# Patient Record
Sex: Male | Born: 1980 | Race: Black or African American | Hispanic: No | Marital: Single | Smoking: Current every day smoker
Health system: Southern US, Community
[De-identification: ages and names within clinical notes are randomized; demographics above are authoritative.]

## PROBLEM LIST (undated history)

## (undated) DIAGNOSIS — J45909 Unspecified asthma, uncomplicated: Secondary | ICD-10-CM

## (undated) HISTORY — PX: MOUTH SURGERY: SHX715

---

## 1998-03-09 ENCOUNTER — Emergency Department (HOSPITAL_COMMUNITY): Admission: EM | Admit: 1998-03-09 | Discharge: 1998-03-09 | Payer: Self-pay | Admitting: Emergency Medicine

## 2004-01-17 ENCOUNTER — Emergency Department (HOSPITAL_COMMUNITY): Admission: AD | Admit: 2004-01-17 | Discharge: 2004-01-17 | Payer: Self-pay | Admitting: Family Medicine

## 2006-03-22 ENCOUNTER — Emergency Department (HOSPITAL_COMMUNITY): Admission: EM | Admit: 2006-03-22 | Discharge: 2006-03-23 | Payer: Self-pay | Admitting: Emergency Medicine

## 2006-03-29 ENCOUNTER — Emergency Department (HOSPITAL_COMMUNITY): Admission: EM | Admit: 2006-03-29 | Discharge: 2006-03-29 | Payer: Self-pay | Admitting: Family Medicine

## 2006-12-26 ENCOUNTER — Emergency Department (HOSPITAL_COMMUNITY): Admission: EM | Admit: 2006-12-26 | Discharge: 2006-12-26 | Payer: Self-pay | Admitting: *Deleted

## 2014-02-07 ENCOUNTER — Encounter (HOSPITAL_COMMUNITY): Payer: Self-pay | Admitting: Emergency Medicine

## 2014-02-07 ENCOUNTER — Emergency Department (HOSPITAL_COMMUNITY)
Admission: EM | Admit: 2014-02-07 | Discharge: 2014-02-07 | Disposition: A | Discharge status: Home / Self Care | Payer: Self-pay | Attending: Emergency Medicine | Admitting: Emergency Medicine

## 2014-02-07 DIAGNOSIS — S61209A Unspecified open wound of unspecified finger without damage to nail, initial encounter: Secondary | ICD-10-CM | POA: Insufficient documentation

## 2014-02-07 DIAGNOSIS — W268XXA Contact with other sharp object(s), not elsewhere classified, initial encounter: Secondary | ICD-10-CM | POA: Insufficient documentation

## 2014-02-07 DIAGNOSIS — S61219A Laceration without foreign body of unspecified finger without damage to nail, initial encounter: Secondary | ICD-10-CM

## 2014-02-07 DIAGNOSIS — Z23 Encounter for immunization: Secondary | ICD-10-CM | POA: Insufficient documentation

## 2014-02-07 DIAGNOSIS — Y929 Unspecified place or not applicable: Secondary | ICD-10-CM | POA: Insufficient documentation

## 2014-02-07 DIAGNOSIS — F17200 Nicotine dependence, unspecified, uncomplicated: Secondary | ICD-10-CM | POA: Insufficient documentation

## 2014-02-07 DIAGNOSIS — Y9389 Activity, other specified: Secondary | ICD-10-CM | POA: Insufficient documentation

## 2014-02-07 MED ORDER — TETANUS-DIPHTH-ACELL PERTUSSIS 5-2.5-18.5 LF-MCG/0.5 IM SUSP
0.5000 mL | Freq: Once | INTRAMUSCULAR | Status: AC
  Administered 2014-02-07: 0.5 mL via INTRAMUSCULAR
  Filled 2014-02-07: qty 0.5

## 2014-02-07 NOTE — ED Provider Notes (Signed)
CSN: [id number]     Arrival date & time 02/07/14  1237 History  This chart was scribed for Santiago GladHeather Iracema Lanagan, PA-C  working with Juliet RudeNathan R. Rubin PayorPickering, MD by Ashley JacobsBrittany Andrews, ED scribe. This patient was seen in room TR05C/TR05C and the patient's care was started at 1:33 PM.     First MD Initiated Contact with Patient 02/07/14 1249     Chief Complaint  Patient presents with  . Laceration     (Consider location/radiation/quality/duration/timing/severity/associated sxs/prior Treatment) HPI HPI Comments: Shelva Majesticravis L Goodell is a 33 y.o. male who presents to the Emergency Department complaining of a laceration to his left middle finger. Pt lacerated his finger with a razor while reaching in a drawer. Denies numbness and tingling.  Denies taking any pain medication. The bleeding is controlled. He cleaned the site PTA. Pt does not have a tetanus shot.     History reviewed. No pertinent past medical history. History reviewed. No pertinent past surgical history. History reviewed. No pertinent family history. History  Substance Use Topics  . Smoking status: Current Every Day Smoker    Types: Cigarettes  . Smokeless tobacco: Not on file  . Alcohol Use: Yes    Review of Systems  Skin: Positive for wound.       Left middle finger laceration    Neurological: Negative for numbness.       No tingling   All other systems reviewed and are negative.     Allergies  Review of patient's allergies indicates no known allergies.  Home Medications   Prior to Admission medications   Not on File   BP 120/81  Pulse 84  Temp(Src) 97.8 F (36.6 C) (Oral)  Resp 15  Ht 6\' 2"  (1.88 m)  Wt 220 lb (99.791 kg)  BMI 28.23 kg/m2  SpO2 99% Physical Exam  Nursing note and vitals reviewed. Constitutional: He is oriented to person, place, and time. He appears well-developed and well-nourished. No distress.  HENT:  Head: Normocephalic and atraumatic.  Cardiovascular: Normal rate, regular rhythm and  normal heart sounds.   Good capillary refil of the left third digit.   Pulmonary/Chest: Effort normal and breath sounds normal.  Musculoskeletal: Normal range of motion.      Neurological: He is alert and oriented to person, place, and time.  Sensation is intact of 3rd digit.   Skin: Skin is warm and dry.  2 cm linear superficial laceration of the finger pad of the third digit. Bleeding is well controlled.  Normal capillary refill of 3rd digit.  Psychiatric: He has a normal mood and affect.    ED Course  Procedures (including critical care time) DIAGNOSTIC STUDIES: Oxygen Saturation is 99% on room air, normal by my interpretation.    COORDINATION OF CARE:   1:37 PM Discussed course of care with pt which includes T-dap vaccination and wound care. Pt understands and agrees. 2:42 PM LACERATION REPAIR Performed by: Santiago GladHeather Latrina Guttman Consent: Verbal consent obtained. Risks and benefits: risks, benefits and alternatives were discussed Time out performed prior to procedure Prepped and Draped in normal sterile fashion Wound explored Laceration Location: left middle finger Laceration Length: 2 cm No Foreign Bodies seen or palpated Skin closure: Dermabond Topical Skin Adhesive Patient tolerance: Patient tolerated the procedure well with no immediate complications.   Labs Review Labs Reviewed - No data to display  Imaging Review No results found.   EKG Interpretation None      MDM   Final diagnoses:  None   Patient presenting  with superficial laceration of the third digit.  Laceration repaired with Dermabond.  Laceration cleaned well.  Tetanus updated.  Patient neurovascularly intact.  Stable for discharge.    Santiago GladHeather Jadis Pitter, PA-C 02/10/14 2311

## 2014-02-07 NOTE — ED Notes (Signed)
Pt cut posterior aspect of L middle finger with razor just pta. Bleeding controlled. Cms intact.

## 2014-02-12 NOTE — ED Provider Notes (Signed)
Medical screening examination/treatment/procedure(s) were performed by non-physician practitioner and as supervising physician I was immediately available for consultation/collaboration.   EKG Interpretation None       Finnley Larusso R. Gearl Baratta, MD 02/12/14 0814 

## 2019-02-19 ENCOUNTER — Emergency Department (HOSPITAL_COMMUNITY): Payer: Self-pay

## 2019-02-19 ENCOUNTER — Encounter (HOSPITAL_COMMUNITY): Payer: Self-pay | Admitting: Emergency Medicine

## 2019-02-19 ENCOUNTER — Other Ambulatory Visit: Payer: Self-pay

## 2019-02-19 ENCOUNTER — Emergency Department (HOSPITAL_COMMUNITY)
Admission: EM | Admit: 2019-02-19 | Discharge: 2019-02-19 | Disposition: A | Discharge status: Home / Self Care | Payer: Self-pay | Attending: Physical Medicine & Rehabilitation | Admitting: Physical Medicine & Rehabilitation

## 2019-02-19 DIAGNOSIS — Y939 Activity, unspecified: Secondary | ICD-10-CM | POA: Insufficient documentation

## 2019-02-19 DIAGNOSIS — Y999 Unspecified external cause status: Secondary | ICD-10-CM | POA: Insufficient documentation

## 2019-02-19 DIAGNOSIS — F17210 Nicotine dependence, cigarettes, uncomplicated: Secondary | ICD-10-CM | POA: Insufficient documentation

## 2019-02-19 DIAGNOSIS — Y929 Unspecified place or not applicable: Secondary | ICD-10-CM | POA: Insufficient documentation

## 2019-02-19 DIAGNOSIS — S61411A Laceration without foreign body of right hand, initial encounter: Secondary | ICD-10-CM | POA: Insufficient documentation

## 2019-02-19 DIAGNOSIS — Z23 Encounter for immunization: Secondary | ICD-10-CM | POA: Insufficient documentation

## 2019-02-19 DIAGNOSIS — S42002A Fracture of unspecified part of left clavicle, initial encounter for closed fracture: Secondary | ICD-10-CM | POA: Insufficient documentation

## 2019-02-19 DIAGNOSIS — F1290 Cannabis use, unspecified, uncomplicated: Secondary | ICD-10-CM | POA: Insufficient documentation

## 2019-02-19 MED ORDER — SODIUM CHLORIDE 0.9 % IV BOLUS
500.0000 mL | Freq: Once | INTRAVENOUS | Status: AC
  Administered 2019-02-19: 500 mL via INTRAVENOUS

## 2019-02-19 MED ORDER — MORPHINE SULFATE (PF) 4 MG/ML IV SOLN
4.0000 mg | Freq: Once | INTRAVENOUS | Status: AC
  Administered 2019-02-19: 4 mg via INTRAVENOUS
  Filled 2019-02-19: qty 1

## 2019-02-19 MED ORDER — MORPHINE SULFATE (PF) 4 MG/ML IV SOLN
4.0000 mg | Freq: Once | INTRAVENOUS | Status: AC
  Administered 2019-02-19: 17:00:00 4 mg via INTRAVENOUS
  Filled 2019-02-19: qty 1

## 2019-02-19 MED ORDER — TETANUS-DIPHTH-ACELL PERTUSSIS 5-2.5-18.5 LF-MCG/0.5 IM SUSP
0.5000 mL | Freq: Once | INTRAMUSCULAR | Status: AC
  Administered 2019-02-19: 0.5 mL via INTRAMUSCULAR
  Filled 2019-02-19: qty 0.5

## 2019-02-19 MED ORDER — CEFAZOLIN SODIUM-DEXTROSE 1-4 GM/50ML-% IV SOLN: 1.0000 g | Freq: Once | INTRAVENOUS | Status: DC

## 2019-02-19 MED ORDER — CEPHALEXIN 500 MG PO CAPS: 500.0000 mg | ORAL_CAPSULE | Freq: Four times a day (QID) | ORAL | 0 refills | Status: AC

## 2019-02-19 MED ORDER — HYDROCODONE-ACETAMINOPHEN 5-325 MG PO TABS: 1.0000 | ORAL_TABLET | Freq: Four times a day (QID) | ORAL | 0 refills | Status: DC | PRN

## 2019-02-19 MED ORDER — LIDOCAINE HCL 2 % IJ SOLN
10.0000 mL | Freq: Once | INTRAMUSCULAR | Status: DC
  Filled 2019-02-19: qty 20
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
## 2019-02-19 NOTE — ED Notes (Signed)
Rodman Key [phone], wife, please call with updates

## 2019-02-19 NOTE — Progress Notes (Signed)
Orthopedic Tech Progress Note Patient Details:  Timothy Bryan 08-30-1981 [id number]  Ortho Devices Type of Ortho Device: Shoulder immobilizer Ortho Device/Splint Interventions: Adjustment, Application, Ordered   Post Interventions Patient Tolerated: Well Instructions Provided: Care of device, Adjustment of device   Norva Karvonen T 02/19/2019, 5:17 PM
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
## 2019-02-19 NOTE — Discharge Instructions (Signed)
Please take the antibiotic as directed to help prevent an infection in your wound.  Please follow-up for suture removal at either urgent care ,the emergency department, or your primary care doctor in 14 days.  Please follow-up with the orthopedic doctor in regards to your clavicle fracture.  You were given a prescription for pain medication.  Please take as directed.  Do not drive, operate machinery, go to work or drink alcohol while taking this medication as it can make you very sedated.  Please return to the emergency room immediately if you experience any new or worsening symptoms or any symptoms that indicate worsening infection such as fevers, increased redness/swelling/pain, warmth, or drainage from the affected area.

## 2019-02-21 ENCOUNTER — Ambulatory Visit (INDEPENDENT_AMBULATORY_CARE_PROVIDER_SITE_OTHER): Payer: Self-pay

## 2019-02-21 ENCOUNTER — Other Ambulatory Visit: Payer: Self-pay

## 2019-02-21 ENCOUNTER — Encounter (HOSPITAL_BASED_OUTPATIENT_CLINIC_OR_DEPARTMENT_OTHER): Payer: Self-pay | Admitting: *Deleted

## 2019-02-21 ENCOUNTER — Ambulatory Visit: Payer: Self-pay | Admitting: Orthopaedic Surgery

## 2019-02-21 DIAGNOSIS — S42032A Displaced fracture of lateral end of left clavicle, initial encounter for closed fracture: Secondary | ICD-10-CM

## 2019-02-21 DIAGNOSIS — S4382XA Sprain of other specified parts of left shoulder girdle, initial encounter: Secondary | ICD-10-CM
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
## 2019-02-24 ENCOUNTER — Other Ambulatory Visit: Payer: Self-pay

## 2019-02-24 ENCOUNTER — Encounter (HOSPITAL_BASED_OUTPATIENT_CLINIC_OR_DEPARTMENT_OTHER)
Admission: RE | Admit: 2019-02-24 | Discharge: 2019-02-24 | Disposition: A | Discharge status: Home / Self Care | Payer: Self-pay | Source: Ambulatory Visit | Attending: Orthopaedic Surgery | Admitting: Orthopaedic Surgery

## 2019-02-24 ENCOUNTER — Other Ambulatory Visit (HOSPITAL_COMMUNITY)
Admission: RE | Admit: 2019-02-24 | Discharge: 2019-02-24 | Disposition: A | Discharge status: Home / Self Care | Payer: HRSA Program | Source: Ambulatory Visit | Attending: Orthopaedic Surgery | Admitting: Orthopaedic Surgery

## 2019-02-24 DIAGNOSIS — Z1159 Encounter for screening for other viral diseases: Secondary | ICD-10-CM | POA: Diagnosis not present

## 2019-02-24 DIAGNOSIS — Z01812 Encounter for preprocedural laboratory examination: Secondary | ICD-10-CM | POA: Diagnosis present

## 2019-02-24 LAB — CBC WITH DIFFERENTIAL/PLATELET
Abs Immature Granulocytes: 0.01 10*3/uL (ref 0.00–0.07)
Basophils Absolute: 0 10*3/uL (ref 0.0–0.1)
Basophils Relative: 0 %
Eosinophils Absolute: 0.2 10*3/uL (ref 0.0–0.5)
Eosinophils Relative: 3 %
HCT: 43.8 % (ref 39.0–52.0)
Hemoglobin: 13.9 g/dL (ref 13.0–17.0)
Immature Granulocytes: 0 %
Lymphocytes Relative: 25 %
Lymphs Abs: 1.3 10*3/uL (ref 0.7–4.0)
MCH: 23.8 pg — ABNORMAL LOW (ref 26.0–34.0)
MCHC: 31.7 g/dL (ref 30.0–36.0)
MCV: 74.9 fL — ABNORMAL LOW (ref 80.0–100.0)
Monocytes Absolute: 0.3 10*3/uL (ref 0.1–1.0)
Monocytes Relative: 6 %
Neutro Abs: 3.4 10*3/uL (ref 1.7–7.7)
Neutrophils Relative %: 66 %
Platelets: 299 10*3/uL (ref 150–400)
RBC: 5.85 MIL/uL — ABNORMAL HIGH (ref 4.22–5.81)
RDW: 14 % (ref 11.5–15.5)
WBC: 5.2 10*3/uL (ref 4.0–10.5)
nRBC: 0 % (ref 0.0–0.2)

## 2019-02-24 LAB — COMPREHENSIVE METABOLIC PANEL
ALT: 19 U/L (ref 0–44)
AST: 25 U/L (ref 15–41)
Albumin: 3.7 g/dL (ref 3.5–5.0)
Alkaline Phosphatase: 78 U/L (ref 38–126)
Anion gap: 9 (ref 5–15)
BUN: 5 mg/dL — ABNORMAL LOW (ref 6–20)
CO2: 28 mmol/L (ref 22–32)
Calcium: 9.2 mg/dL (ref 8.9–10.3)
Chloride: 101 mmol/L (ref 98–111)
Creatinine, Ser: 0.98 mg/dL (ref 0.61–1.24)
GFR calc Af Amer: >60 mL/min (ref >60)
GFR calc non Af Amer: >60 mL/min (ref >60)
Glucose, Bld: 96 mg/dL (ref 70–99)
Potassium: 4.2 mmol/L (ref 3.5–5.1)
Sodium: 138 mmol/L (ref 135–145)
Total Bilirubin: 0.5 mg/dL (ref 0.3–1.2)
Total Protein: 7 g/dL (ref 6.5–8.1)

## 2019-02-25 LAB — NOVEL CORONAVIRUS, NAA (HOSP ORDER, SEND-OUT TO REF LAB; TAT 18-24 HRS): SARS-CoV-2, NAA: NOT DETECTED

## 2019-02-25 NOTE — Anesthesia Preprocedure Evaluation (Addendum)
Anesthesia Evaluation  Patient identified by MRN, date of birth, ID band Patient awake    Reviewed: Allergy & Precautions, NPO status , Patient's Chart, lab work & pertinent test results  Airway Mallampati: II  TM Distance: >3 FB Neck ROM: Full    Dental no notable dental hx. (+) Dental Advisory Given   Pulmonary asthma , Current Smoker,    Pulmonary exam normal        Cardiovascular negative cardio ROS Normal cardiovascular exam     Neuro/Psych negative neurological ROS     GI/Hepatic negative GI ROS, Neg liver ROS,   Endo/Other  negative endocrine ROS  Renal/GU negative Renal ROS     Musculoskeletal   Abdominal   Peds  Hematology negative hematology ROS (+)   Anesthesia Other Findings Day of surgery medications reviewed with the patient.  Reproductive/Obstetrics                            Anesthesia Physical Anesthesia Plan  ASA: II  Anesthesia Plan: General   Post-op Pain Management:    Induction: Intravenous  PONV Risk Score and Plan: 2 and Ondansetron and Dexamethasone  Airway Management Planned: Oral ETT  Additional Equipment:   Intra-op Plan:   Post-operative Plan: Extubation in OR  Informed Consent: I have reviewed the patients History and Physical, chart, labs and discussed the procedure including the risks, benefits and alternatives for the proposed anesthesia with the patient or authorized representative who has indicated his/her understanding and acceptance.     Dental advisory given  Plan Discussed with: Anesthesiologist and CRNA  Anesthesia Plan Comments:        Anesthesia Quick Evaluation

## 2019-02-26 ENCOUNTER — Ambulatory Visit (HOSPITAL_BASED_OUTPATIENT_CLINIC_OR_DEPARTMENT_OTHER)
Admission: RE | Admit: 2019-02-26 | Discharge: 2019-02-26 | Disposition: A | Discharge status: Home / Self Care | Payer: Self-pay | Attending: Orthopaedic Surgery | Admitting: Orthopaedic Surgery

## 2019-02-26 ENCOUNTER — Ambulatory Visit (HOSPITAL_COMMUNITY): Payer: Self-pay

## 2019-02-26 ENCOUNTER — Other Ambulatory Visit: Payer: Self-pay

## 2019-02-26 ENCOUNTER — Encounter (HOSPITAL_BASED_OUTPATIENT_CLINIC_OR_DEPARTMENT_OTHER)
Admission: RE | Disposition: A | Discharge status: Home / Self Care | Payer: Self-pay | Source: Home / Self Care | Attending: Orthopaedic Surgery

## 2019-02-26 ENCOUNTER — Ambulatory Visit (HOSPITAL_BASED_OUTPATIENT_CLINIC_OR_DEPARTMENT_OTHER): Payer: Self-pay | Admitting: Anesthesiology

## 2019-02-26 ENCOUNTER — Encounter (HOSPITAL_BASED_OUTPATIENT_CLINIC_OR_DEPARTMENT_OTHER): Payer: Self-pay | Admitting: Anesthesiology

## 2019-02-26 DIAGNOSIS — S42032A Displaced fracture of lateral end of left clavicle, initial encounter for closed fracture: Secondary | ICD-10-CM

## 2019-02-26 DIAGNOSIS — X58XXXA Exposure to other specified factors, initial encounter: Secondary | ICD-10-CM | POA: Insufficient documentation

## 2019-02-26 DIAGNOSIS — S4382XA Sprain of other specified parts of left shoulder girdle, initial encounter: Secondary | ICD-10-CM

## 2019-02-26 DIAGNOSIS — F17210 Nicotine dependence, cigarettes, uncomplicated: Secondary | ICD-10-CM | POA: Insufficient documentation

## 2019-02-26 DIAGNOSIS — Z4789 Encounter for other orthopedic aftercare: Secondary | ICD-10-CM

## 2019-02-26 DIAGNOSIS — J45909 Unspecified asthma, uncomplicated: Secondary | ICD-10-CM | POA: Insufficient documentation

## 2019-02-26 DIAGNOSIS — Z9889 Other specified postprocedural states: Secondary | ICD-10-CM

## 2019-02-26 HISTORY — DX: Unspecified asthma, uncomplicated: J45.909

## 2019-02-26 HISTORY — PX: ORIF CLAVICULAR FRACTURE: SHX5055

## 2019-02-26 SURGERY — OPEN REDUCTION INTERNAL FIXATION (ORIF) CLAVICULAR FRACTURE
Anesthesia: General | Site: Shoulder | Laterality: Left

## 2019-02-26 MED ORDER — CEFAZOLIN SODIUM-DEXTROSE 2-4 GM/100ML-% IV SOLN
INTRAVENOUS | Status: AC
  Filled 2019-02-26: qty 100

## 2019-02-26 MED ORDER — MIDAZOLAM HCL 2 MG/2ML IJ SOLN
INTRAMUSCULAR | Status: AC
  Filled 2019-02-26: qty 2

## 2019-02-26 MED ORDER — FENTANYL CITRATE (PF) 100 MCG/2ML IJ SOLN
INTRAMUSCULAR | Status: AC
  Filled 2019-02-26: qty 2

## 2019-02-26 MED ORDER — FENTANYL CITRATE (PF) 100 MCG/2ML IJ SOLN: 25.0000 ug | INTRAMUSCULAR | Status: DC | PRN

## 2019-02-26 MED ORDER — CEFAZOLIN SODIUM-DEXTROSE 2-4 GM/100ML-% IV SOLN
2.0000 g | INTRAVENOUS | Status: AC
  Administered 2019-02-26: 08:00:00 2 g via INTRAVENOUS

## 2019-02-26 MED ORDER — DEXAMETHASONE SODIUM PHOSPHATE 4 MG/ML IJ SOLN
INTRAMUSCULAR | Status: DC | PRN
  Administered 2019-02-26: 10 mg via INTRAVENOUS

## 2019-02-26 MED ORDER — ONDANSETRON HCL 4 MG/2ML IJ SOLN
INTRAMUSCULAR | Status: AC
  Filled 2019-02-26: qty 2

## 2019-02-26 MED ORDER — ROCURONIUM BROMIDE 100 MG/10ML IV SOLN
INTRAVENOUS | Status: DC | PRN
  Administered 2019-02-26: 70 mg via INTRAVENOUS

## 2019-02-26 MED ORDER — METHOCARBAMOL 750 MG PO TABS: 750.0000 mg | ORAL_TABLET | Freq: Two times a day (BID) | ORAL | 0 refills | Status: AC | PRN

## 2019-02-26 MED ORDER — MIDAZOLAM HCL 2 MG/2ML IJ SOLN
1.0000 mg | INTRAMUSCULAR | Status: DC | PRN
  Administered 2019-02-26: 08:00:00 2 mg via INTRAVENOUS

## 2019-02-26 MED ORDER — PHENYLEPHRINE 40 MCG/ML (10ML) SYRINGE FOR IV PUSH (FOR BLOOD PRESSURE SUPPORT)
PREFILLED_SYRINGE | INTRAVENOUS | Status: DC | PRN
  Administered 2019-02-26: 80 ug via INTRAVENOUS

## 2019-02-26 MED ORDER — BUPIVACAINE HCL (PF) 0.25 % IJ SOLN
INTRAMUSCULAR | Status: DC | PRN
  Administered 2019-02-26: 15 mL

## 2019-02-26 MED ORDER — SUGAMMADEX SODIUM 200 MG/2ML IV SOLN
INTRAVENOUS | Status: DC | PRN
  Administered 2019-02-26: 200 mg via INTRAVENOUS

## 2019-02-26 MED ORDER — PROPOFOL 500 MG/50ML IV EMUL
INTRAVENOUS | Status: AC
  Filled 2019-02-26: qty 50

## 2019-02-26 MED ORDER — BUPIVACAINE LIPOSOME 1.3 % IJ SUSP
INTRAMUSCULAR | Status: DC | PRN
  Administered 2019-02-26: 10 mL via PERINEURAL

## 2019-02-26 MED ORDER — ACETAMINOPHEN 500 MG PO TABS
ORAL_TABLET | ORAL | Status: AC
  Filled 2019-02-26: qty 2

## 2019-02-26 MED ORDER — SODIUM CHLORIDE 0.9 % IR SOLN
Status: DC | PRN
  Administered 2019-02-26: 6000 mL

## 2019-02-26 MED ORDER — BUPIVACAINE-EPINEPHRINE (PF) 0.25% -1:200000 IJ SOLN
INTRAMUSCULAR | Status: DC | PRN
  Administered 2019-02-26: 30 mL

## 2019-02-26 MED ORDER — OXYCODONE-ACETAMINOPHEN 5-325 MG PO TABS: 1.0000 | ORAL_TABLET | Freq: Four times a day (QID) | ORAL | 0 refills | Status: AC | PRN

## 2019-02-26 MED ORDER — FENTANYL CITRATE (PF) 100 MCG/2ML IJ SOLN
50.0000 ug | INTRAMUSCULAR | Status: AC | PRN
  Administered 2019-02-26: 11:00:00 50 ug via INTRAVENOUS
  Administered 2019-02-26: 08:00:00 100 ug via INTRAVENOUS
  Administered 2019-02-26 (×2): 25 ug via INTRAVENOUS

## 2019-02-26 MED ORDER — CHLORHEXIDINE GLUCONATE 4 % EX LIQD: 60.0000 mL | Freq: Once | CUTANEOUS | Status: DC

## 2019-02-26 MED ORDER — DEXAMETHASONE SODIUM PHOSPHATE 10 MG/ML IJ SOLN
INTRAMUSCULAR | Status: AC
  Filled 2019-02-26: qty 1

## 2019-02-26 MED ORDER — LACTATED RINGERS IV SOLN
INTRAVENOUS | Status: DC
  Administered 2019-02-26 (×2): via INTRAVENOUS

## 2019-02-26 MED ORDER — ONDANSETRON HCL 4 MG PO TABS: 4.0000 mg | ORAL_TABLET | Freq: Three times a day (TID) | ORAL | 0 refills | Status: AC | PRN

## 2019-02-26 MED ORDER — ONDANSETRON HCL 4 MG/2ML IJ SOLN
INTRAMUSCULAR | Status: DC | PRN
  Administered 2019-02-26: 4 mg via INTRAVENOUS

## 2019-02-26 MED ORDER — SUGAMMADEX SODIUM 200 MG/2ML IV SOLN
INTRAVENOUS | Status: AC
  Filled 2019-02-26: qty 2

## 2019-02-26 MED ORDER — PROMETHAZINE HCL 25 MG/ML IJ SOLN: 6.2500 mg | INTRAMUSCULAR | Status: DC | PRN

## 2019-02-26 MED ORDER — SCOPOLAMINE 1 MG/3DAYS TD PT72: 1.0000 | MEDICATED_PATCH | Freq: Once | TRANSDERMAL | Status: DC | PRN

## 2019-02-26 MED ORDER — ROCURONIUM BROMIDE 10 MG/ML (PF) SYRINGE
PREFILLED_SYRINGE | INTRAVENOUS | Status: AC
  Filled 2019-02-26: qty 10

## 2019-02-26 MED ORDER — LACTATED RINGERS IV SOLN
INTRAVENOUS | Status: DC
  Administered 2019-02-26: 07:00:00 via INTRAVENOUS

## 2019-02-26 MED ORDER — PROPOFOL 10 MG/ML IV BOLUS
INTRAVENOUS | Status: DC | PRN
  Administered 2019-02-26: 50 mg via INTRAVENOUS
  Administered 2019-02-26: 150 mg via INTRAVENOUS

## 2019-02-26 MED ORDER — LIDOCAINE 2% (20 MG/ML) 5 ML SYRINGE
INTRAMUSCULAR | Status: AC
  Filled 2019-02-26: qty 5

## 2019-02-26 MED ORDER — ACETAMINOPHEN 500 MG PO TABS
1000.0000 mg | ORAL_TABLET | Freq: Once | ORAL | Status: AC
  Administered 2019-02-26: 1000 mg via ORAL

## 2019-02-26 SURGICAL SUPPLY — 73 items
APL SKNCLS STERI-STRIP NONHPOA (GAUZE/BANDAGES/DRESSINGS)
BENZOIN TINCTURE PRP APPL 2/3 (GAUZE/BANDAGES/DRESSINGS) IMPLANT
BIT DRILL 2 CANN GRADUATED (BIT) ×2 IMPLANT
BIT DRILL 2.5 CANN ENDOSCOPIC (BIT) ×2 IMPLANT
BIT DRILL 2.5 CANN LNG (BIT) ×2 IMPLANT
BLADE SURG 15 STRL LF DISP TIS (BLADE) ×2 IMPLANT
BLADE SURG 15 STRL SS (BLADE) ×6
BUTTON DIST CLAVICLE PLATE TR (Anchor) ×2 IMPLANT
CLOSURE WOUND 1/2 X4 (GAUZE/BANDAGES/DRESSINGS) ×1
COVER WAND RF STERILE (DRAPES) IMPLANT
DRAPE C-ARM 42X72 X-RAY (DRAPES) ×3 IMPLANT
DRAPE IMP U-DRAPE 54X76 (DRAPES) ×3 IMPLANT
DRAPE INCISE IOBAN 66X45 STRL (DRAPES) ×3 IMPLANT
DRAPE U-SHAPE 47X51 STRL (DRAPES) ×6 IMPLANT
DRAPE U-SHAPE 76X120 STRL (DRAPES) ×6 IMPLANT
DRSG MEPILEX BORDER 4X8 (GAUZE/BANDAGES/DRESSINGS) ×2 IMPLANT
DRSG PAD ABDOMINAL 8X10 ST (GAUZE/BANDAGES/DRESSINGS) ×3 IMPLANT
DURAPREP 26ML APPLICATOR (WOUND CARE) ×3 IMPLANT
ELECT REM PT RETURN 9FT ADLT (ELECTROSURGICAL) ×3
ELECTRODE REM PT RTRN 9FT ADLT (ELECTROSURGICAL) ×1 IMPLANT
GAUZE SPONGE 4X4 12PLY STRL (GAUZE/BANDAGES/DRESSINGS) ×3 IMPLANT
GLOVE BIO SURGEON STRL SZ 6.5 (GLOVE) ×1 IMPLANT
GLOVE BIO SURGEONS STRL SZ 6.5 (GLOVE) ×1
GLOVE BIOGEL PI IND STRL 7.0 (GLOVE) ×1 IMPLANT
GLOVE BIOGEL PI IND STRL 8 (GLOVE) IMPLANT
GLOVE BIOGEL PI INDICATOR 7.0 (GLOVE) ×4
GLOVE BIOGEL PI INDICATOR 8 (GLOVE) ×2
GLOVE ECLIPSE 7.0 STRL STRAW (GLOVE) ×3 IMPLANT
GLOVE SKINSENSE NS SZ7.5 (GLOVE) ×2
GLOVE SKINSENSE STRL SZ7.5 (GLOVE) ×1 IMPLANT
GLOVE SURG SYN 7.5  E (GLOVE) ×2
GLOVE SURG SYN 7.5 E (GLOVE) ×1 IMPLANT
GLOVE SURG SYN 7.5 PF PI (GLOVE) ×1 IMPLANT
GOWN SRG XL LVL 4 BRTHBL STRL (GOWNS) ×1 IMPLANT
GOWN STRL NON-REIN XL LVL4 (GOWNS) ×3
GOWN STRL REUS W/ TWL LRG LVL3 (GOWN DISPOSABLE) ×1 IMPLANT
GOWN STRL REUS W/ TWL XL LVL3 (GOWN DISPOSABLE) ×1 IMPLANT
GOWN STRL REUS W/TWL LRG LVL3 (GOWN DISPOSABLE) ×3
GOWN STRL REUS W/TWL XL LVL3 (GOWN DISPOSABLE) ×9 IMPLANT
K-WIRE BB-TAK (WIRE) ×3
KWIRE BB-TAK (WIRE) IMPLANT
MANIFOLD NEPTUNE II (INSTRUMENTS) ×2 IMPLANT
PACK ARTHROSCOPY DSU (CUSTOM PROCEDURE TRAY) ×3 IMPLANT
PACK BASIN DAY SURGERY FS (CUSTOM PROCEDURE TRAY) ×3 IMPLANT
PENCIL BUTTON HOLSTER BLD 10FT (ELECTRODE) ×3 IMPLANT
PIN DRILL 3.7MM KNTLS DIST (PIN) ×2 IMPLANT
PLATE 5H DISTAL THIRD (Plate) ×2 IMPLANT
SCREW LOCK T10 FT 18X2.7X (Screw) IMPLANT
SCREW LOCKING 2.7X12 ANKLE (Screw) ×2 IMPLANT
SCREW LOCKING 2.7X14MM (Screw) ×2 IMPLANT
SCREW LOCKING 2.7X18MM (Screw) ×3 IMPLANT
SCREW LOW PROFILE 3.5X14 (Screw) ×2 IMPLANT
SCREW LOW PROFILE 3.5X16 (Screw) ×2 IMPLANT
SCREW NON-LOCKING 3.5X12MM (Screw) ×2 IMPLANT
SCREW TM SS 2.7X14 CORTEX (Screw) ×2 IMPLANT
SLEEVE SCD COMPRESS KNEE MED (MISCELLANEOUS) ×3 IMPLANT
SLING ARM FOAM STRAP LRG (SOFTGOODS) IMPLANT
SLING ARM MED ADULT FOAM STRAP (SOFTGOODS) IMPLANT
SLING ARM XL FOAM STRAP (SOFTGOODS) IMPLANT
SPONGE LAP 18X18 RF (DISPOSABLE) ×6 IMPLANT
STRIP CLOSURE SKIN 1/2X4 (GAUZE/BANDAGES/DRESSINGS) ×1 IMPLANT
SUCTION FRAZIER HANDLE 10FR (MISCELLANEOUS) ×2
SUCTION TUBE FRAZIER 10FR DISP (MISCELLANEOUS) ×1 IMPLANT
SUT FIBERWIRE #2 38 T-5 BLUE (SUTURE)
SUT MNCRL AB 4-0 PS2 18 (SUTURE) ×3 IMPLANT
SUT VIC AB 0 CT1 18XCR BRD 8 (SUTURE) IMPLANT
SUT VIC AB 0 CT1 8-18 (SUTURE) ×3
SUT VIC AB 2-0 CT1 27 (SUTURE) ×3
SUT VIC AB 2-0 CT1 TAPERPNT 27 (SUTURE) ×1 IMPLANT
SUTURE FIBERWR #2 38 T-5 BLUE (SUTURE) IMPLANT
SYR BULB 3OZ (MISCELLANEOUS) ×3 IMPLANT
TOWEL GREEN STERILE FF (TOWEL DISPOSABLE) ×3 IMPLANT
YANKAUER SUCT BULB TIP NO VENT (SUCTIONS) ×3 IMPLANT

## 2019-02-26 NOTE — Progress Notes (Signed)
Assisted Dr. Singer with left, ultrasound guided, interscalene  block. Side rails up, monitors on throughout procedure. See vital signs in flow sheet. Tolerated Procedure well.  

## 2019-02-26 NOTE — Transfer of Care (Signed)
Immediate Anesthesia Transfer of Care Note  Patient: Timothy Bryan  Procedure(s) Performed: OPEN REDUCTION INTERNAL FIXATION (ORIF) LEFT CLAVICLE FRACTURE, CORACOCLAVICULAR RECONSTRUCTION (Left Shoulder)  Patient Location: PACU  Anesthesia Type:GA combined with regional for post-op pain  Level of Consciousness: awake, sedated and patient cooperative  Airway & Oxygen Therapy: Patient Spontanous Breathing and Patient connected to nasal cannula oxygen  Post-op Assessment: Report given to RN and Post -op Vital signs reviewed and stable  Post vital signs: Reviewed and stable  Last Vitals:  Vitals Value Taken Time  BP    Temp    Pulse 79 02/26/2019 11:01 AM  Resp 10 02/26/2019 11:01 AM  SpO2 99 % 02/26/2019 11:01 AM  Vitals shown include unvalidated device data.  Last Pain:  Vitals:   02/26/19 0720  TempSrc: Oral  PainSc: 0-No pain         Complications: No apparent anesthesia complications

## 2019-02-26 NOTE — Anesthesia Postprocedure Evaluation (Signed)
Anesthesia Post Note  Patient: Timothy Bryan  Procedure(s) Performed: OPEN REDUCTION INTERNAL FIXATION (ORIF) LEFT CLAVICLE FRACTURE, CORACOCLAVICULAR RECONSTRUCTION (Left Shoulder)     Patient location during evaluation: PACU Anesthesia Type: General Level of consciousness: awake and alert Pain management: pain level controlled Vital Signs Assessment: post-procedure vital signs reviewed and stable Respiratory status: spontaneous breathing, nonlabored ventilation and respiratory function stable Cardiovascular status: blood pressure returned to baseline and stable Postop Assessment: no apparent nausea or vomiting Anesthetic complications: no    Last Vitals:  Vitals:   02/26/19 1130 02/26/19 1131  BP:  139/88  Pulse: 73 91  Resp: 18 20  Temp:    SpO2: 96% 96%    Last Pain:  Vitals:   02/26/19 1131  TempSrc:   PainSc: 0-No pain                 Kaylyn Layer

## 2019-02-26 NOTE — H&P (Signed)
PREOPERATIVE H&P  Chief Complaint: left distal clavicle fracture, coracoclavicular ligament rupture  HPI: Timothy Bryan is a 38 y.o. male who presents for surgical treatment of left distal clavicle fracture, coracoclavicular ligament rupture.  He denies any changes in medical history.  Past Medical History:  Diagnosis Date  . Asthma    Past Surgical History:  Procedure Laterality Date  . MOUTH SURGERY     Social History   Socioeconomic History  . Marital status: Single    Spouse name: Not on file  . Number of children: Not on file  . Years of education: Not on file  . Highest education level: Not on file  Occupational History  . Not on file  Social Needs  . Financial resource strain: Not on file  . Food insecurity:    Worry: Not on file    Inability: Not on file  . Transportation needs:    Medical: Not on file    Non-medical: Not on file  Tobacco Use  . Smoking status: Current Every Day Smoker    Types: Cigarettes  . Smokeless tobacco: Never Used  Substance and Sexual Activity  . Alcohol use: Yes  . Drug use: Yes    Types: Marijuana    Comment: last smoked 02/25/2019  . Sexual activity: Not on file  Lifestyle  . Physical activity:    Days per week: Not on file    Minutes per session: Not on file  . Stress: Not on file  Relationships  . Social connections:    Talks on phone: Not on file    Gets together: Not on file    Attends religious service: Not on file    Active member of club or organization: Not on file    Attends meetings of clubs or organizations: Not on file    Relationship status: Not on file  Other Topics Concern  . Not on file  Social History Narrative  . Not on file   History reviewed. No pertinent family history. No Known Allergies Prior to Admission medications   Medication Sig Start Date End Date Taking? Authorizing Provider  cephALEXin (KEFLEX) 500 MG capsule Take 1 capsule (500 mg total) by mouth 4 (four) times daily for 7 days.  02/19/19 02/26/19 Yes Couture, Cortni S, PA-C  HYDROcodone-acetaminophen (NORCO/VICODIN) 5-325 MG tablet Take 1 tablet by mouth every 6 (six) hours as needed. 02/19/19  Yes Couture, Cortni S, PA-C  albuterol (VENTOLIN HFA) 108 (90 Base) MCG/ACT inhaler Inhale into the lungs every 6 (six) hours as needed for wheezing or shortness of breath.    Alver FisherLindner, Jodi N, RN     Positive ROS: All other systems have been reviewed and were otherwise negative with the exception of those mentioned in the HPI and as above.  Physical Exam: General: Alert, no acute distress Cardiovascular: No pedal edema Respiratory: No cyanosis, no use of accessory musculature GI: abdomen soft Skin: No lesions in the area of chief complaint Neurologic: Sensation intact distally Psychiatric: Patient is competent for consent with normal mood and affect Lymphatic: no lymphedema  MUSCULOSKELETAL: exam stable  Assessment: left distal clavicle fracture, coracoclavicular ligament rupture  Plan: Plan for Procedure(s): OPEN REDUCTION INTERNAL FIXATION (ORIF) LEFT CLAVICLE FRACTURE, CORACOCLAVICULAR RECONSTRUCTION  The risks benefits and alternatives were discussed with the patient including but not limited to the risks of nonoperative treatment, versus surgical intervention including infection, bleeding, nerve injury,  blood clots, cardiopulmonary complications, morbidity, mortality, among others, and they were willing to proceed.  Glee Arvin, MD   02/26/2019 8:05 AM

## 2019-02-26 NOTE — Op Note (Signed)
Date of Surgery: 02/26/2019  INDICATIONS: Mr. Timothy Bryan is a 38 y.o.-year-old male with a left lateral clavicle fracture with rupture of medial CC ligament;  The patient did consent to the procedure after discussion of the risks and benefits.  PREOPERATIVE DIAGNOSIS: Left displaced lateral clavicle fracture with rupture of medial CC ligament  POSTOPERATIVE DIAGNOSIS: Same.  PROCEDURE: Open reduction internal fixation of left lateral clavicle fracture with reconstruction of coracoclavicular ligament  SURGEON: N. Glee ArvinMichael Xu, M.D.  ASSIST: Starlyn SkeansMary Lindsey NageeziStanbery, [place]PA-C; necessary for the timely completion of procedure and due to complexity of procedure.  ANESTHESIA:  general, regional  IV FLUIDS AND URINE: See anesthesia.  ESTIMATED BLOOD LOSS: 50 mL.  IMPLANTS: Arthrex lateral clavicle plate and tight rope  DRAINS: None  COMPLICATIONS: None.  DESCRIPTION OF PROCEDURE: The patient was brought to the operating room and placed beachchair on the operating table.  The patient had been signed prior to the procedure and this was documented. The patient had the anesthesia placed by the anesthesiologist.  A time-out was performed to confirm that this was the correct patient, site, side and location. The patient did receive antibiotics prior to the incision and was re-dosed during the procedure as needed at indicated intervals.  The patient had the operative extremity prepped and draped in the standard surgical fashion.    A horizontal incision was made directly over the lateral aspect of the clavicle.  Dissection was carried down through the subcutaneous tissue onto the muscle.  The muscle was then elevated off of the anterior and superior aspect of the distal clavicle shaft.  Full-thickness flaps were created.  We dissected with a Bovie cautery inferiorly towards the coracoid.  Care was taken to keep the dissection on bone.  Dissection was also then carried out laterally to expose the entire fracture  and the distal portion of the clavicle as well as the [facility] joint.  There was a large portion of the distal clavicle that was amenable to screw fixation.  I obtained a reduction of the fracture using a lobster claw.  The reduction was confirmed under fluoroscopy.  I then carefully placed a precontoured lateral clavicle plate on the superior aspect of the clavicle at the appropriate position.  This was confirmed under fluoroscopy.  With the fracture reduced I placed 2 nonlocking screws through the medial portion of the plate in a bicortical fashion with excellent purchase.  I then placed three2.7 millimeter screws through the lateral cluster of the plate into the distal clavicle segment.  The clamp was removed and the fracture remained reduced.  I then placed a third nonlocking screw in a bicortical fashion through the medial aspect of the plate.  After this was done I then turned my attention to reconstruction of the CC ligaments.  Using direct visualization I drilled from the top of the clavicle through the plate out the inferior portion of the clavicle and then down through the base of the coracoid.  Care was taken not to plunge.  After this was done the drill was removed and the tight rope button was advanced from a superior to inferior direction.  Once the button was inferior to the base of the coracoid the button was then flipped and confirmed under fluoroscopy.  The superior button was then advanced into the oblong hole of the plate and appropriately tensioned.  Several knots were tied.  The suture was then cut short.  Final fluoroscopy x-rays were taken.  The wound was then thoroughly irrigated with  normal saline and closed in a layered fashion using 0 Vicryl for the fascia, 2-0 Vicryl for the subcuticular layer, with 4-0 Monocryl for the skin.  Betadine Steri-Strips were placed.  Sterile dressings were applied.  Shoulder sling was placed.  Patient tolerated procedure well had no immediate complications.   POSTOPERATIVE PLAN: Patient will remain in the sling until follow-up appointment.  We will see him back in about a week.  Mayra Reel, MD [facility] [phone] 10:21 AM

## 2019-02-26 NOTE — Discharge Instructions (Signed)
**You had 1000 mg of Tylenol at 7:23AM  Postoperative instructions:  Weightbearing instructions: non weight bearing  Dressing instructions: Keep your dressing and/or splint clean and dry at all times.  It will be removed at your first post-operative appointment.  Your stitches and/or staples will be removed at this visit.  Incision instructions:  Do not soak your incision for 3 weeks after surgery.  If the incision gets wet, pat dry and do not scrub the incision.  Pain control:  You have been given a prescription to be taken as directed for post-operative pain control.  In addition, elevate the operative extremity above the heart at all times to prevent swelling and throbbing pain.  Take over-the-counter Colace, 100mg  by mouth twice a day while taking narcotic pain medications to help prevent constipation.  Follow up appointments: 1) 12-14 days for suture removal and wound check. 2) Dr. Roda Shutters as scheduled.   -------------------------------------------------------------------------------------------------------------  After Surgery Pain Control:  After your surgery, post-surgical discomfort or pain is likely. This discomfort can last several days to a few weeks. At certain times of the day your discomfort may be more intense.  Did you receive a nerve block?  A nerve block can provide pain relief for one hour to two days after your surgery. As long as the nerve block is working, you will experience little or no sensation in the area the surgeon operated on.  As the nerve block wears off, you will begin to experience pain or discomfort. It is very important that you begin taking your prescribed pain medication before the nerve block fully wears off. Treating your pain at the first sign of the block wearing off will ensure your pain is better controlled and more tolerable when full-sensation returns. Do not wait until the pain is intolerable, as the medicine will be less effective. It is better to  treat pain in advance than to try and catch up.  General Anesthesia:  If you did not receive a nerve block during your surgery, you will need to start taking your pain medication shortly after your surgery and should continue to do so as prescribed by your surgeon.  Pain Medication:  Most commonly we prescribe Vicodin and Percocet for post-operative pain. Both of these medications contain a combination of acetaminophen (Tylenol) and a narcotic to help control pain.   It takes between 30 and 45 minutes before pain medication starts to work. It is important to take your medication before your pain level gets too intense.   Nausea is a common side effect of many pain medications. You will want to eat something before taking your pain medicine to help prevent nausea.   If you are taking a prescription pain medication that contains acetaminophen, we recommend that you do not take additional over the counter acetaminophen (Tylenol).  Other pain relieving options:   Using a cold pack to ice the affected area a few times a day (15 to 20 minutes at a time) can help to relieve pain, reduce swelling and bruising.   Elevation of the affected area can also help to reduce pain and swelling.      Post Anesthesia Home Care Instructions  Activity: Get plenty of rest for the remainder of the day. A responsible individual must stay with you for 24 hours following the procedure.  For the next 24 hours, DO NOT: -Drive a car -Advertising copywriter -Drink alcoholic beverages -Take any medication unless instructed by your physician -Make any legal decisions or sign important  papers.  Meals: Start with liquid foods such as gelatin or soup. Progress to regular foods as tolerated. Avoid greasy, spicy, heavy foods. If nausea and/or vomiting occur, drink only clear liquids until the nausea and/or vomiting subsides. Call your physician if vomiting continues.  Special Instructions/Symptoms: Your throat may feel  dry or sore from the anesthesia or the breathing tube placed in your throat during surgery. If this causes discomfort, gargle with warm salt water. The discomfort should disappear within 24 hours.  If you had a scopolamine patch placed behind your ear for the management of post- operative nausea and/or vomiting:  1. The medication in the patch is effective for 72 hours, after which it should be removed.  Wrap patch in a tissue and discard in the trash. Wash hands thoroughly with soap and water. 2. You may remove the patch earlier than 72 hours if you experience unpleasant side effects which may include dry mouth, dizziness or visual disturbances. 3. Avoid touching the patch. Wash your hands with soap and water after contact with the patch.    Regional Anesthesia Blocks  1. Numbness or the inability to move the "blocked" extremity may last from 3-48 hours after placement. The length of time depends on the medication injected and your individual response to the medication. If the numbness is not going away after 48 hours, call your surgeon.  2. The extremity that is blocked will need to be protected until the numbness is gone and the  Strength has returned. Because you cannot feel it, you will need to take extra care to avoid injury. Because it may be weak, you may have difficulty moving it or using it. You may not know what position it is in without looking at it while the block is in effect.  3. For blocks in the legs and feet, returning to weight bearing and walking needs to be done carefully. You will need to wait until the numbness is entirely gone and the strength has returned. You should be able to move your leg and foot normally before you try and bear weight or walk. You will need someone to be with you when you first try to ensure you do not fall and possibly risk injury.  4. Bruising and tenderness at the needle site are common side effects and will resolve in a few days.  5. Persistent  numbness or new problems with movement should be communicated to the surgeon or the [place] [phone]).

## 2019-02-26 NOTE — Anesthesia Procedure Notes (Signed)
Procedure Name: Intubation Date/Time: 02/26/2019 8:33 AM Performed by: Gar Gibbon, CRNA Pre-anesthesia Checklist: Patient identified, Emergency Drugs available, Suction available and Patient being monitored Patient Re-evaluated:Patient Re-evaluated prior to induction Oxygen Delivery Method: Circle system utilized Preoxygenation: Pre-oxygenation with 100% oxygen Induction Type: IV induction Ventilation: Mask ventilation without difficulty Laryngoscope Size: Glidescope Grade View: Grade II Tube type: Oral Tube size: 8.0 mm Number of attempts: 1 Airway Equipment and Method: Stylet and Oral airway Placement Confirmation: ETT inserted through vocal cords under direct vision,  positive ETCO2 and breath sounds checked- equal and bilateral Secured at: 23 cm Tube secured with: Tape Dental Injury: Teeth and Oropharynx as per pre-operative assessment

## 2019-02-26 NOTE — Anesthesia Procedure Notes (Signed)
Anesthesia Regional Block: Interscalene brachial plexus block   Pre-Anesthetic Checklist: ,, timeout performed, Correct Patient, Correct Site, Correct Laterality, Correct Procedure, Correct Position, site marked, Risks and benefits discussed,  Surgical consent,  Pre-op evaluation,  At surgeon's request and post-op pain management  Laterality: Left  Prep: chloraprep       Needles:  Injection technique: Single-shot  Needle Type: Echogenic Stimulator Needle     Needle Length: 5cm  Needle Gauge: 22     Additional Needles:   Narrative:  Start time: 02/26/2019 7:44 AM End time: 02/26/2019 7:54 AM Injection made incrementally with aspirations every 5 mL.  Performed by: Personally  Anesthesiologist: Heather Roberts, MD  Additional Notes: Functioning IV was confirmed and monitors applied.  A 48mm 22ga echogenic arrow stimulator was used. Sterile prep and drape,hand hygiene and sterile gloves were used.Ultrasound guidance: relevant anatomy identified, needle position confirmed, local anesthetic spread visualized around nerve(s)., vascular puncture avoided.  Image printed for medical record.  Negative aspiration and negative test dose prior to incremental administration of local anesthetic. The patient tolerated the procedure well.

## 2019-02-28 ENCOUNTER — Encounter (HOSPITAL_BASED_OUTPATIENT_CLINIC_OR_DEPARTMENT_OTHER): Payer: Self-pay | Admitting: Orthopaedic Surgery

## 2019-03-07 ENCOUNTER — Ambulatory Visit: Payer: Self-pay | Admitting: Orthopaedic Surgery

## 2019-03-11 ENCOUNTER — Ambulatory Visit: Payer: Self-pay | Admitting: Physician Assistant

## 2019-03-11 ENCOUNTER — Encounter: Payer: Self-pay | Admitting: Orthopaedic Surgery

## 2019-03-11 ENCOUNTER — Other Ambulatory Visit: Payer: Self-pay

## 2019-03-11 ENCOUNTER — Ambulatory Visit (INDEPENDENT_AMBULATORY_CARE_PROVIDER_SITE_OTHER): Payer: Self-pay

## 2019-03-11 DIAGNOSIS — S42032A Displaced fracture of lateral end of left clavicle, initial encounter for closed fracture: Secondary | ICD-10-CM

## 2019-03-11 MED ORDER — HYDROCODONE-ACETAMINOPHEN 5-325 MG PO TABS: 1.0000 | ORAL_TABLET | Freq: Two times a day (BID) | ORAL | 0 refills | Status: AC | PRN

## 2019-03-11 NOTE — Progress Notes (Signed)
   Post-Op Visit Note   Patient: Timothy Bryan           Date of Birth: 09/02/1981           MRN: [id number] Visit Date: 03/11/2019 PCP: Patient, No Pcp Per   Assessment & Plan:  Chief Complaint:  Chief Complaint  Patient presents with  . Left Shoulder - Pain, Routine Post Op   Visit Diagnoses:  1. Displaced fracture of lateral end of left clavicle, initial encounter for closed fracture     Plan: patient is a pleasant 38 year old gentleman who is here 13 days status post ORIF left clavicle fracture and coracoclavicular ligament reconstruction, date of surgery 02/26/2019.  He has been doing well.  He has been noncompliant with wearing his sling.  He has minimal to moderate pain at times which is relieved with narcotic pain medication.  No fevers or chills.  Examination of his clavicle reveals a well-healing surgical incision without evidence of infection.  He is neurovascularly intact distally.  Today, we applied Steri-Strips.  I have refilled his Norco.  He will not abduct his shoulder past 90 degrees.  He will not lift anything greater than his car keys or cell phone.  He will follow-up with [place] in 4 weeks time for repeat evaluation and x-rays.  Call with concerns or questions in the meantime.  Follow-Up Instructions: Return in about 4 weeks (around 04/08/2019).   Orders:  Orders Placed This Encounter  Procedures  . XR Clavicle Left   Meds ordered this encounter  Medications  . HYDROcodone-acetaminophen (NORCO) 5-325 MG tablet    Sig: Take 1-2 tablets by mouth 2 (two) times daily as needed for moderate pain.    Dispense:  20 tablet    Refill:  0    Imaging: Xr Clavicle Left  Result Date: 03/11/2019 Stable alignment of the hardware without complication   PMFS History: Patient Active Problem List   Diagnosis Date Noted  . Displaced fracture of lateral end of left clavicle, initial encounter for closed fracture 02/21/2019  . Sprain, coracoclavicular ligament, left, initial  encounter 02/21/2019   Past Medical History:  Diagnosis Date  . Asthma     History reviewed. No pertinent family history.  Past Surgical History:  Procedure Laterality Date  . MOUTH SURGERY    . ORIF CLAVICULAR FRACTURE Left 02/26/2019   Procedure: OPEN REDUCTION INTERNAL FIXATION (ORIF) LEFT CLAVICLE FRACTURE, CORACOCLAVICULAR RECONSTRUCTION;  Surgeon: Tarry Kos, MD;  Location: [facility];  Service: Orthopedics;  Laterality: Left;   Social History   Occupational History  . Not on file  Tobacco Use  . Smoking status: Current Every Day Smoker    Types: Cigarettes  . Smokeless tobacco: Never Used  Substance and Sexual Activity  . Alcohol use: Yes  . Drug use: Yes    Types: Marijuana    Comment: last smoked 02/25/2019  . Sexual activity: Not on file

## 2019-04-10 ENCOUNTER — Ambulatory Visit: Payer: Self-pay | Admitting: Physician Assistant

## 2019-04-15 ENCOUNTER — Ambulatory Visit: Payer: Self-pay | Admitting: Orthopaedic Surgery
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
## 2020-05-04 IMAGING — CR PORTABLE CHEST - 1 VIEW
1 series · 1 of 1 positions shown · non-contrast
Comparison: Chest x-ray 02/19/2019 intraoperative radiographs
02/26/2019

CLINICAL DATA: Status post repair of clavicle fracture. Question
pneumothorax.

EXAM:
PORTABLE CHEST 1 VIEW

[chest ap]
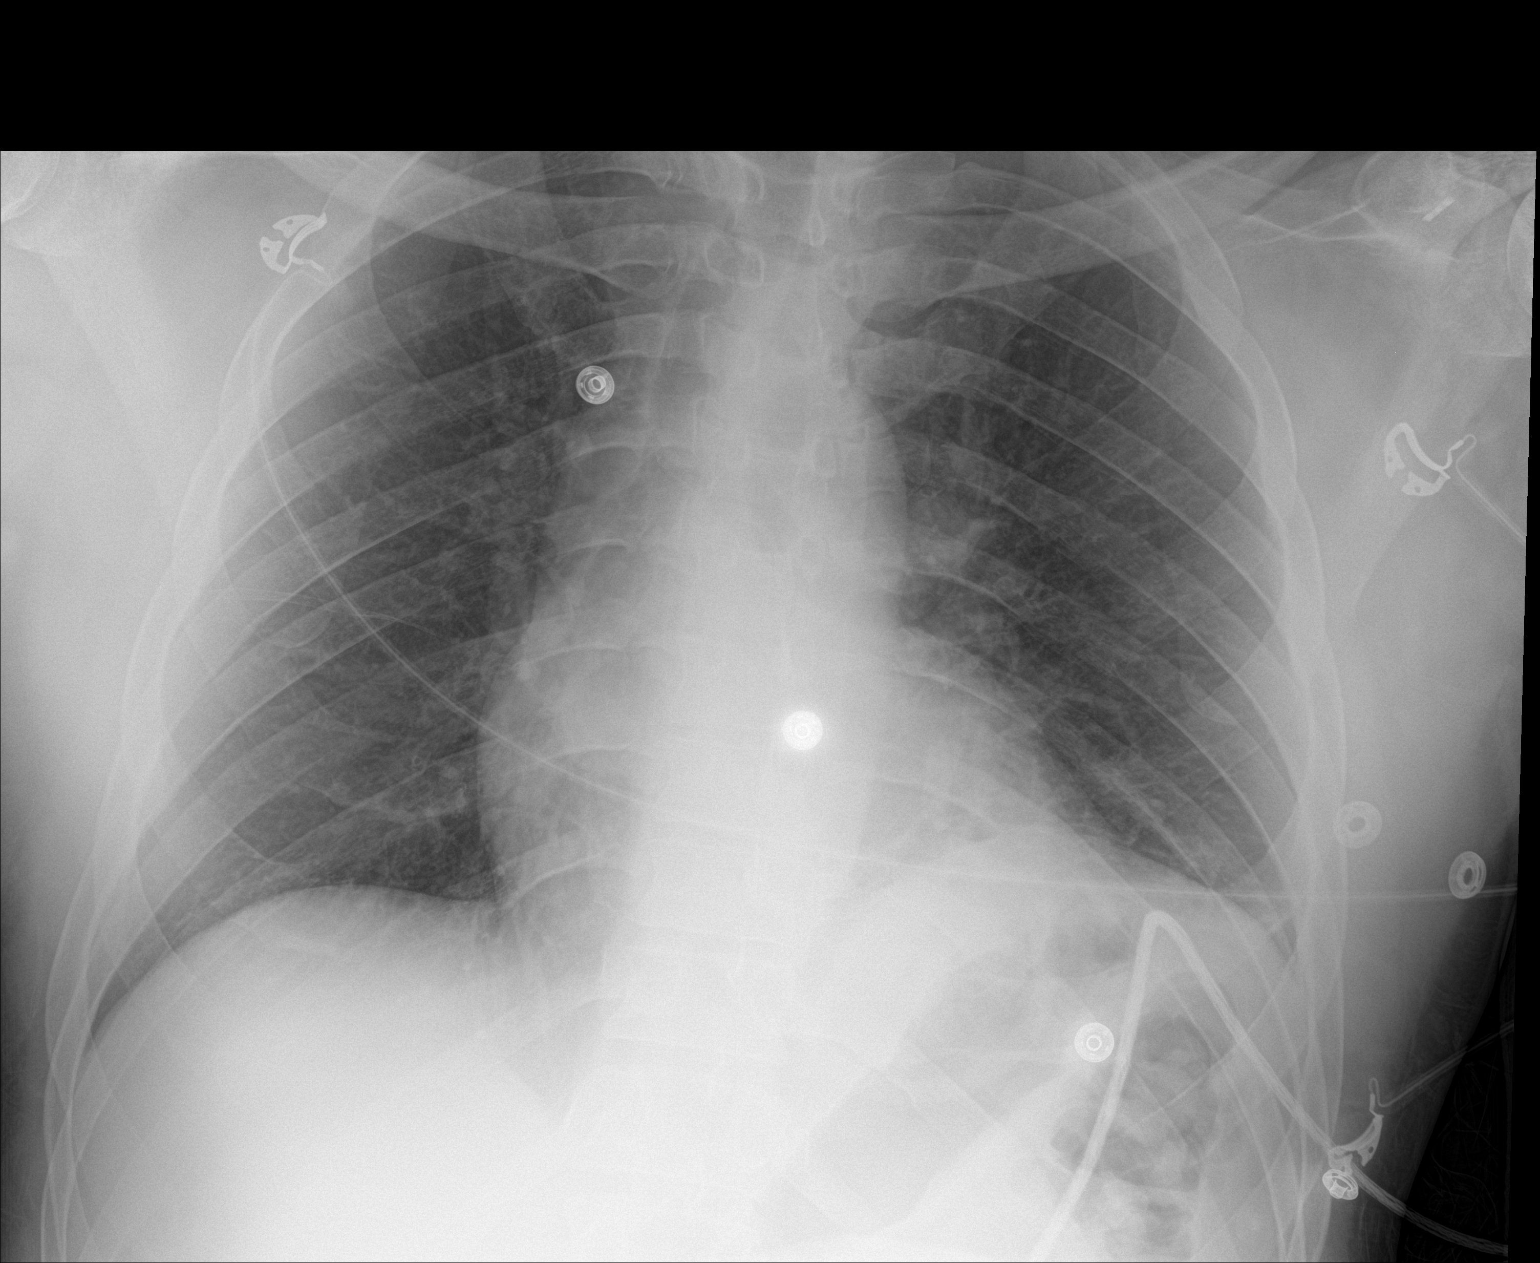

[1 of 1 positions shown; findings below may reference images not displayed]

FINDINGS: The heart size is normal. There is some elevation of left
hemidiaphragm. Mild left basilar airspace disease likely reflects
atelectasis. The right lung is clear. There is no edema or effusion.

Postoperative changes of the distal clavicle are noted.
IMPRESSION: 1. ORIF of the left clavicle.
2. Slight volume loss in the left lung without pneumothorax or
significant effusion.
